# Patient Record
Sex: Male | Born: 1995 | Race: Black or African American | Hispanic: No | Marital: Single | Smoking: Never smoker
Health system: Southern US, Community
[De-identification: ages and names within clinical notes are randomized; demographics above are authoritative.]

---

## 2015-05-31 ENCOUNTER — Emergency Department (HOSPITAL_COMMUNITY)
Admission: EM | Admit: 2015-05-31 | Discharge: 2015-06-01 | Disposition: A | Discharge status: Home / Self Care | Payer: 59 | Attending: Emergency Medicine | Admitting: Emergency Medicine

## 2015-05-31 ENCOUNTER — Emergency Department (HOSPITAL_COMMUNITY): Payer: 59

## 2015-05-31 ENCOUNTER — Encounter (HOSPITAL_COMMUNITY): Payer: Self-pay | Admitting: Emergency Medicine

## 2015-05-31 DIAGNOSIS — R0789 Other chest pain: Secondary | ICD-10-CM | POA: Insufficient documentation

## 2015-05-31 DIAGNOSIS — R079 Chest pain, unspecified: Secondary | ICD-10-CM | POA: Diagnosis present

## 2015-05-31 LAB — BASIC METABOLIC PANEL
Anion gap: 11 (ref 5–15)
BUN: 11 mg/dL (ref 6–20)
CALCIUM: 9.9 mg/dL (ref 8.9–10.3)
CO2: 28 mmol/L (ref 22–32)
CREATININE: 1.08 mg/dL (ref 0.61–1.24)
Chloride: 100 mmol/L — ABNORMAL LOW (ref 101–111)
GFR calc Af Amer: >60 mL/min (ref >60)
GLUCOSE: 93 mg/dL (ref 65–99)
Potassium: 4.2 mmol/L (ref 3.5–5.1)
Sodium: 139 mmol/L (ref 135–145)

## 2015-05-31 LAB — CBC
HEMATOCRIT: 41.1 % (ref 39.0–52.0)
Hemoglobin: 13.3 g/dL (ref 13.0–17.0)
MCH: 26.5 pg (ref 26.0–34.0)
MCHC: 32.4 g/dL (ref 30.0–36.0)
MCV: 81.9 fL (ref 78.0–100.0)
PLATELETS: 271 10*3/uL (ref 150–400)
RBC: 5.02 MIL/uL (ref 4.22–5.81)
RDW: 12.9 % (ref 11.5–15.5)
WBC: 5 10*3/uL (ref 4.0–10.5)

## 2015-05-31 LAB — I-STAT TROPONIN, ED: Troponin i, poc: 0 ng/mL (ref 0.00–0.08)

## 2015-05-31 MED ORDER — IBUPROFEN 800 MG PO TABS
800.0000 mg | ORAL_TABLET | Freq: Once | ORAL | Status: AC
  Administered 2015-05-31: 800 mg via ORAL
  Filled 2015-05-31: qty 1

## 2015-05-31 NOTE — ED Provider Notes (Signed)
History  By signing my name below, I, Karle Plumber, attest that this documentation has been prepared under the direction and in the presence of Dione Booze, MD. Electronically Signed: Karle Plumber, ED Scribe. 06/01/2015. 12:10 AM.  Chief Complaint  Patient presents with  . Chest Pain   The history is provided by the patient and medical records. No language interpreter was used.    HPI Comments:  Reginald Wong is a 20 y.o. male who presents to the Emergency Department complaining of intermittent burning, left-sided, non-radiating CP that started about 5 days ago. He states the pain lasts for approximately 30-45 minutes when it begins. He has not taken anything to treat his pain. He denies modifying factors. He denies SOB, sweats, nausea, fever, chills or vomiting. He denies alcohol or tobacco use. He denies h/o hypertension, DM or HLD. He denies any recent strenuous activities that may have caused injury or trauma. He does not have a PCP.  History reviewed. No pertinent past medical history. History reviewed. No pertinent past surgical history. No family history on file. Social History  Substance Use Topics  . Smoking status: Never Smoker   . Smokeless tobacco: None  . Alcohol Use: No    Review of Systems  Cardiovascular: Positive for chest pain.  All other systems reviewed and are negative.  Allergies  Review of patient's allergies indicates no known allergies.  Home Medications   Prior to Admission medications   Not on File   Triage Vitals: BP 128/79 mmHg  Pulse 64  Temp(Src) 97.6 F (36.4 C) (Oral)  Resp 18  Ht  (1.753 m)  Wt 182 lb (82.555 kg)  BMI 26.86 kg/m2  SpO2 100% Physical Exam  Constitutional: He is oriented to person, place, and time. He appears well-developed and well-nourished.  HENT:  Head: Normocephalic and atraumatic.  Eyes: EOM are normal. Pupils are equal, round, and reactive to light.  Neck: Normal range of motion. Neck supple.  No JVD present.  Cardiovascular: Normal rate and regular rhythm.  Exam reveals no gallop and no friction rub.   No murmur heard. Pulmonary/Chest: Effort normal and breath sounds normal. He has no wheezes. He has no rales. He exhibits tenderness.  Mild tenderness to left parasternal area.  Abdominal: Soft. Bowel sounds are normal. He exhibits no distension and no mass. There is no tenderness.  Musculoskeletal: Normal range of motion. He exhibits no edema.  Lymphadenopathy:    He has no cervical adenopathy.  Neurological: He is alert and oriented to person, place, and time. No cranial nerve deficit. He exhibits normal muscle tone. Coordination normal.  Skin: Skin is warm and dry. No rash noted.  Psychiatric: He has a normal mood and affect. His behavior is normal. Judgment and thought content normal.  Nursing note and vitals reviewed.   ED Course  Procedures (including critical care time) DIAGNOSTIC STUDIES: Oxygen Saturation is 100% on RA, normal by my interpretation.   COORDINATION OF CARE: 11:28 PM- Informed pt of negative CXR, labs and normal EKG. Pt verbalizes understanding and agrees to plan.  Medications  ibuprofen (ADVIL,MOTRIN) tablet 800 mg (800 mg Oral Given 05/31/15 2332)    Labs Review Results for orders placed or performed during the hospital encounter of 05/31/15  Basic metabolic panel  Result Value Ref Range   Sodium 139 135 - 145 mmol/L   Potassium 4.2 3.5 - 5.1 mmol/L   Chloride 100 (L) 101 - 111 mmol/L   CO2 28 22 - 32 mmol/L  Glucose, Bld 93 65 - 99 mg/dL   BUN 11 6 - 20 mg/dL   Creatinine, Ser 1.61 0.61 - 1.24 mg/dL   Calcium 9.9 8.9 - 09.6 mg/dL   GFR calc non Af Amer >60 >60 mL/min   GFR calc Af Amer >60 >60 mL/min   Anion gap 11 5 - 15  CBC  Result Value Ref Range   WBC 5.0 4.0 - 10.5 K/uL   RBC 5.02 4.22 - 5.81 MIL/uL   Hemoglobin 13.3 13.0 - 17.0 g/dL   HCT 04.5 40.9 - 81.1 %   MCV 81.9 78.0 - 100.0 fL   MCH 26.5 26.0 - 34.0 pg   MCHC 32.4  30.0 - 36.0 g/dL   RDW 91.4 78.2 - 95.6 %   Platelets 271 150 - 400 K/uL  I-stat troponin, ED (not at [facility], [facility])  Result Value Ref Range   Troponin i, poc 0.00 0.00 - 0.08 ng/mL   Comment 3            Imaging Review Dg Chest 2 View  05/31/2015  CLINICAL DATA:  Left-sided chest pain for 5 days. EXAM: CHEST  2 VIEW COMPARISON:  None. FINDINGS: The heart size and mediastinal contours are within normal limits. Both lungs are clear. The visualized skeletal structures are unremarkable. IMPRESSION: No active cardiopulmonary disease. Electronically Signed   By: Kennith Center M.D.   On: 05/31/2015 21:32   I have personally reviewed and evaluated these images and lab results as part of my medical decision-making.   EKG Interpretation   Date/Time:  Friday May 31 2015 21:13:27 EST Ventricular Rate:  69 PR Interval:  172 QRS Duration: 94 QT Interval:  370 QTC Calculation: 396 R Axis:   93 Text Interpretation:  Normal sinus rhythm Rightward axis Borderline ECG  Early repolarization No old tracing to compare Confirmed by [facility]  MD,  Vicke Plotner ([id number]) on 05/31/2015 10:56:17 PM      MDM   Final diagnoses:  Chest wall pain    Chest pain which appears to be chest wall pain. No red factors suggest more serious pathology. ECG and chest x-ray are normal. He is given a dose of ibuprofen with excellent relief of pain. He is discharged with prescription for naproxen.  I personally performed the services described in this documentation, which was scribed in my presence. The recorded information has been reviewed and is accurate.      Dione Booze, MD 06/01/15 [phone]

## 2015-05-31 NOTE — ED Notes (Signed)
C/o intermittent non-radiating pressure to L chest since Monday.  Denies sob, nausea, and vomiting.

## 2015-06-01 MED ORDER — NAPROXEN 500 MG PO TABS: 500.0000 mg | ORAL_TABLET | Freq: Two times a day (BID) | ORAL | Status: AC

## 2015-06-01 NOTE — Discharge Instructions (Signed)
Chest Wall Pain Chest wall pain is pain in or around the bones and muscles of your chest. Sometimes, an injury causes this pain. Sometimes, the cause may not be known. This pain may take several weeks or longer to get better. HOME CARE INSTRUCTIONS  Pay attention to any changes in your symptoms. Take these actions to help with your pain:   Rest as told by your health care provider.   Avoid activities that cause pain. These include any activities that use your chest muscles or your abdominal and side muscles to lift heavy items.   If directed, apply ice to the painful area:  Put ice in a plastic bag.  Place a towel between your skin and the bag.  Leave the ice on for 20 minutes, 2-3 times per day.  Take over-the-counter and prescription medicines only as told by your health care provider.  Do not use tobacco products, including cigarettes, chewing tobacco, and e-cigarettes. If you need help quitting, ask your health care provider.  Keep all follow-up visits as told by your health care provider. This is important. SEEK MEDICAL CARE IF:  You have a fever.  Your chest pain becomes worse.  You have new symptoms. SEEK IMMEDIATE MEDICAL CARE IF:  You have nausea or vomiting.  You feel sweaty or light-headed.  You have a cough with phlegm (sputum) or you cough up blood.  You develop shortness of breath.   This information is not intended to replace advice given to you by your health care provider. Make sure you discuss any questions you have with your health care provider.   Document Released: 04/20/2005 Document Revised: 01/09/2015 Document Reviewed: 07/16/2014 Elsevier Interactive Patient Education 2016 [employer]  Naproxen and naproxen sodium oral immediate-release tablets What is this medicine? NAPROXEN (na PROX en) is a non-steroidal anti-inflammatory drug (NSAID). It is used to reduce swelling and to treat pain. This medicine may be used for dental pain, headache,  or painful monthly periods. It is also used for painful joint and muscular problems such as arthritis, tendinitis, bursitis, and gout. This medicine may be used for other purposes; ask your health care provider or pharmacist if you have questions. What should I tell my health care provider before I take this medicine? They need to know if you have any of these conditions: -asthma -cigarette smoker -drink more than 3 alcohol containing drinks a day -heart disease or circulation problems such as heart failure or leg edema (fluid retention) -high blood pressure -kidney disease -liver disease -stomach bleeding or ulcers -an unusual or allergic reaction to naproxen, aspirin, other NSAIDs, other medicines, foods, dyes, or preservatives -pregnant or trying to get pregnant -breast-feeding How should I use this medicine? Take this medicine by mouth with a glass of water. Follow the directions on the prescription label. Take it with food if your stomach gets upset. Try to not lie down for at least 10 minutes after you take it. Take your medicine at regular intervals. Do not take your medicine more often than directed. Long-term, continuous use may increase the risk of heart attack or stroke. A special MedGuide will be given to you by the pharmacist with each prescription and refill. Be sure to read this information carefully each time. Talk to your pediatrician regarding the use of this medicine in children. Special care may be needed. Overdosage: If you think you have taken too much of this medicine contact a poison control center or emergency room at once. NOTE: This medicine is  only for you. Do not share this medicine with others. What if I miss a dose? If you miss a dose, take it as soon as you can. If it is almost time for your next dose, take only that dose. Do not take double or extra doses. What may interact with this  medicine? -alcohol -aspirin -cidofovir -diuretics -lithium -methotrexate -other drugs for inflammation like ketorolac or prednisone -pemetrexed -probenecid -warfarin This list may not describe all possible interactions. Give your health care provider a list of all the medicines, herbs, non-prescription drugs, or dietary supplements you use. Also tell them if you smoke, drink alcohol, or use illegal drugs. Some items may interact with your medicine. What should I watch for while using this medicine? Tell your doctor or health care professional if your pain does not get better. Talk to your doctor before taking another medicine for pain. Do not treat yourself. This medicine does not prevent heart attack or stroke. In fact, this medicine may increase the chance of a heart attack or stroke. The chance may increase with longer use of this medicine and in people who have heart disease. If you take aspirin to prevent heart attack or stroke, talk with your doctor or health care professional. Do not take other medicines that contain aspirin, ibuprofen, or naproxen with this medicine. Side effects such as stomach upset, nausea, or ulcers may be more likely to occur. Many medicines available without a prescription should not be taken with this medicine. This medicine can cause ulcers and bleeding in the stomach and intestines at any time during treatment. Do not smoke cigarettes or drink alcohol. These increase irritation to your stomach and can make it more susceptible to damage from this medicine. Ulcers and bleeding can happen without warning symptoms and can cause death. You may get drowsy or dizzy. Do not drive, use machinery, or do anything that needs mental alertness until you know how this medicine affects you. Do not stand or sit up quickly, especially if you are an older patient. This reduces the risk of dizzy or fainting spells. This medicine can cause you to bleed more easily. Try to avoid damage  to your teeth and gums when you brush or floss your teeth. What side effects may I notice from receiving this medicine? Side effects that you should report to your doctor or health care professional as soon as possible: -black or bloody stools, blood in the urine or vomit -blurred vision -chest pain -difficulty breathing or wheezing -nausea or vomiting -severe stomach pain -skin rash, skin redness, blistering or peeling skin, hives, or itching -slurred speech or weakness on one side of the body -swelling of eyelids, throat, lips -unexplained weight gain or swelling -unusually weak or tired -yellowing of eyes or skin Side effects that usually do not require medical attention (report to your doctor or health care professional if they continue or are bothersome): -constipation -headache -heartburn This list may not describe all possible side effects. Call your doctor for medical advice about side effects. You may report side effects to FDA at 1-800-FDA-1088. Where should I keep my medicine? Keep out of the reach of children. Store at room temperature between 15 and 30 degrees C (59 and 86 degrees F). Keep container tightly closed. Throw away any unused medicine after the expiration date. NOTE: This sheet is a summary. It may not cover all possible information. If you have questions about this medicine, talk to your doctor, pharmacist, or health care provider.  2016, Elsevier/Gold Standard. (2009-04-22 20:10:16) ° °

## 2016-01-02 ENCOUNTER — Other Ambulatory Visit: Payer: Self-pay | Admitting: Physical Medicine and Rehabilitation

## 2016-01-02 ENCOUNTER — Ambulatory Visit
Admission: RE | Admit: 2016-01-02 | Discharge: 2016-01-02 | Disposition: A | Discharge status: Home / Self Care | Payer: No Typology Code available for payment source | Source: Ambulatory Visit | Attending: Physical Medicine and Rehabilitation | Admitting: Physical Medicine and Rehabilitation

## 2016-01-02 DIAGNOSIS — Z0000 Encounter for general adult medical examination without abnormal findings: Secondary | ICD-10-CM

## 2017-02-12 IMAGING — CR DG CHEST 2V
2 series · 2 of 2 positions shown · non-contrast
Comparison: None.

CLINICAL DATA: Left-sided chest pain for 5 days.

EXAM:
CHEST  2 VIEW

[chest pa]
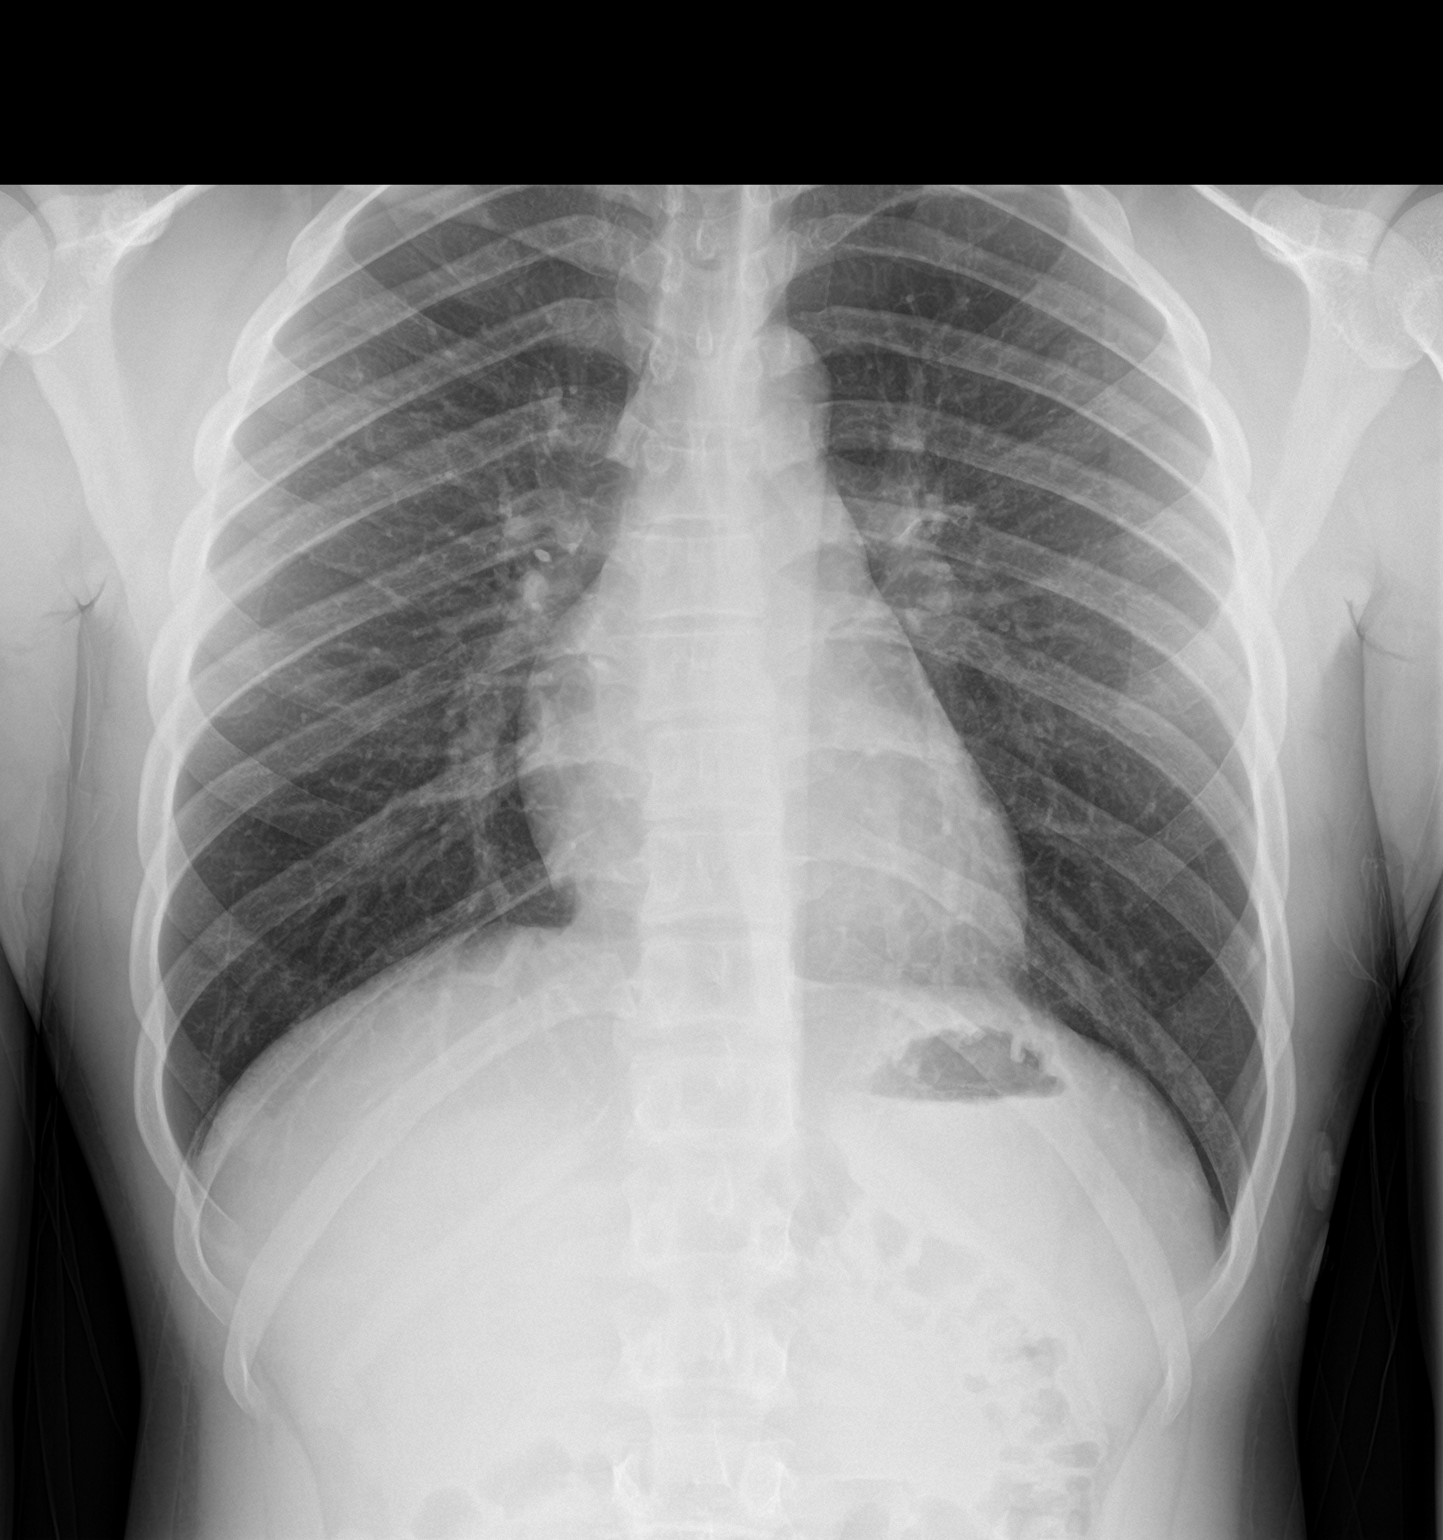

[chest lat]
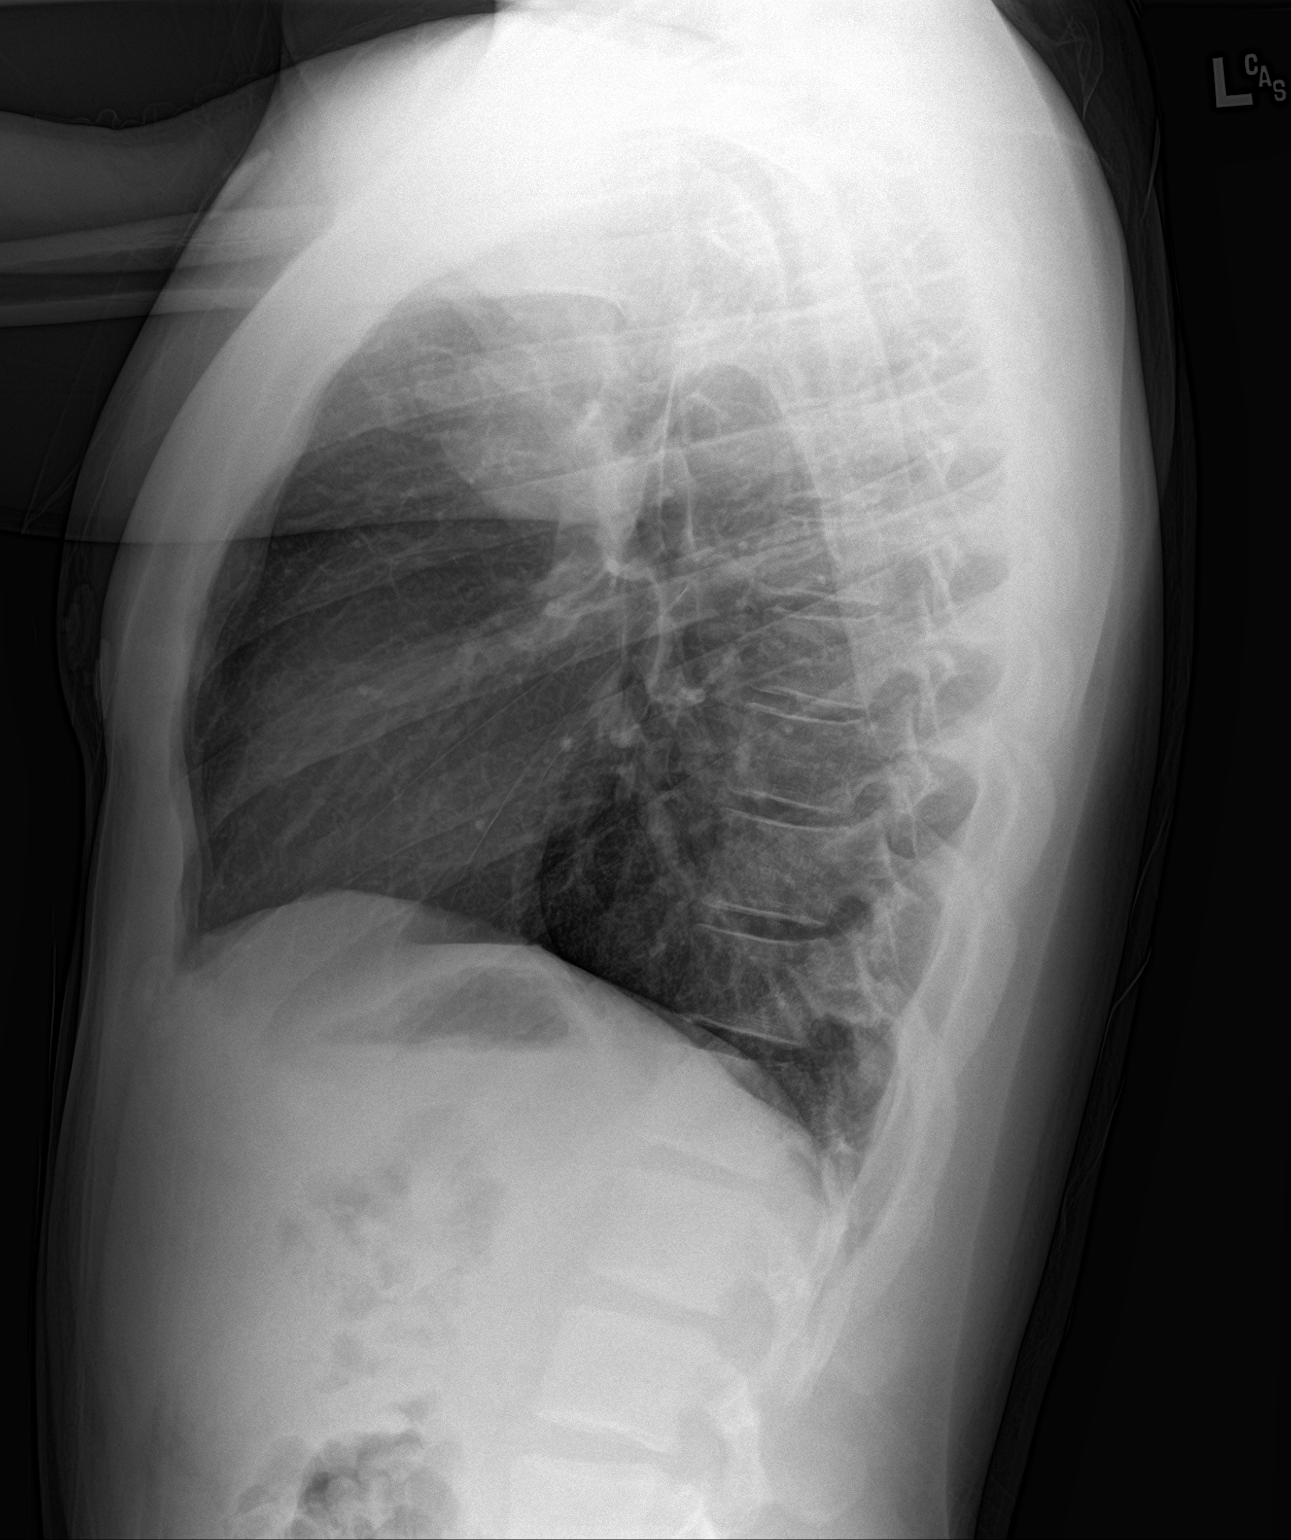

[2 of 2 positions shown; findings below may reference images not displayed]

FINDINGS: The heart size and mediastinal contours are within normal limits.
Both lungs are clear. The visualized skeletal structures are
unremarkable.
IMPRESSION: No active cardiopulmonary disease.

## 2017-09-16 IMAGING — CR DG CHEST 1V
1 series · 1 of 1 positions shown · non-contrast
Comparison: PA and lateral chest x-ray May 31, 2015

CLINICAL DATA: Physical examination. No chest complaints.
Nonsmoker.

EXAM:
CHEST 1 VIEW

[w chest pa]
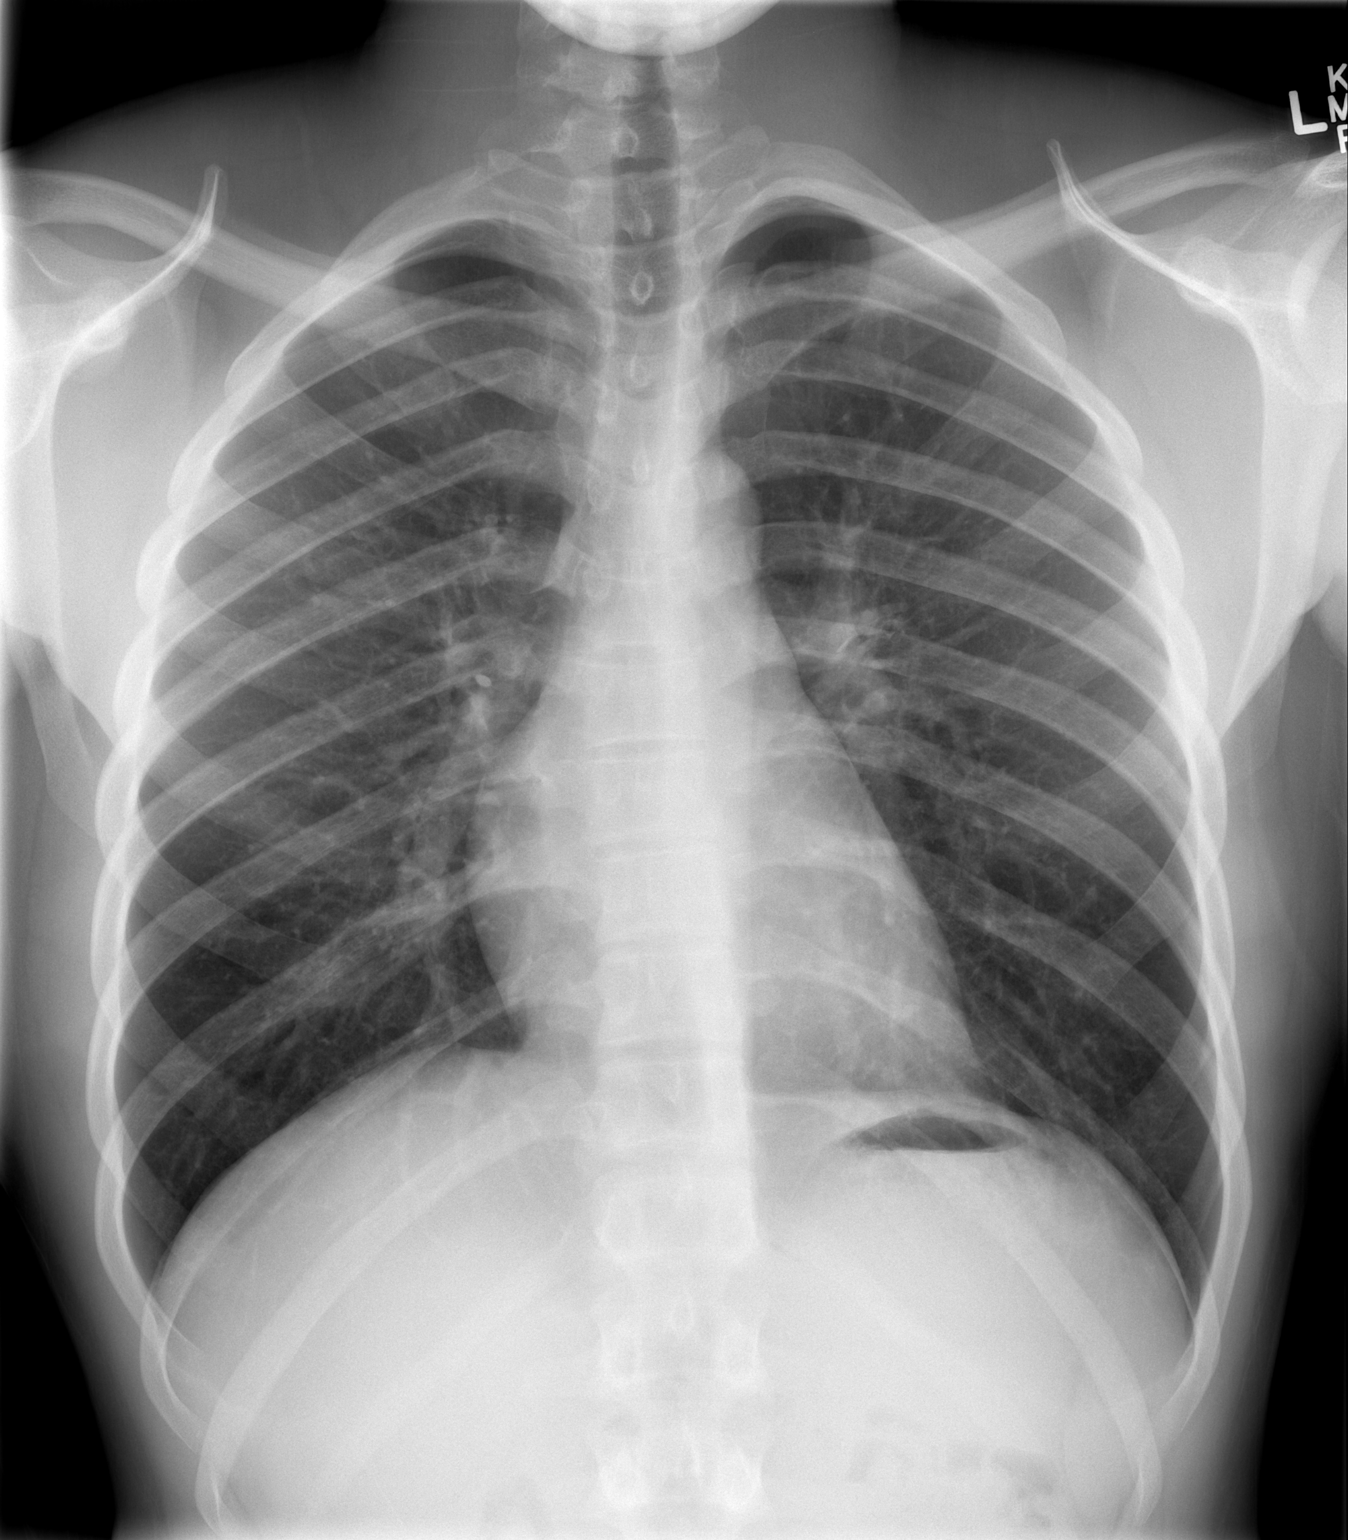

[1 of 1 positions shown; findings below may reference images not displayed]

FINDINGS: The lungs are adequately inflated and clear. The heart and pulmonary
vascularity are normal. The mediastinum is normal in width. There is
no pleural effusion. The bony thorax exhibits no acute abnormality.
IMPRESSION: There is no active cardiopulmonary disease.
